# Patient Record
Sex: Female | Born: 1980 | Race: White | Hispanic: No | Marital: Single
Health system: Midwestern US, Academic
[De-identification: ages and names within clinical notes are randomized; demographics above are authoritative.]

---

## 2018-05-23 ENCOUNTER — Encounter: Admit: 2018-05-23 | Discharge: 2018-05-23 | Payer: BC Managed Care – HMO | Primary: Family

## 2018-05-23 DIAGNOSIS — R000 Tachycardia, unspecified: Principal | ICD-10-CM

## 2018-05-29 ENCOUNTER — Encounter: Admit: 2018-05-29 | Discharge: 2018-05-29 | Payer: BC Managed Care – HMO | Primary: Family

## 2018-05-29 DIAGNOSIS — R000 Tachycardia, unspecified: Principal | ICD-10-CM

## 2018-06-14 ENCOUNTER — Encounter: Admit: 2018-06-14 | Discharge: 2018-06-14 | Payer: BC Managed Care – HMO

## 2018-06-14 ENCOUNTER — Ambulatory Visit: Admit: 2018-06-14 | Discharge: 2018-06-14 | Payer: BC Managed Care – HMO | Primary: Family

## 2018-06-14 ENCOUNTER — Ambulatory Visit: Admit: 2018-06-14 | Discharge: 2018-06-15 | Payer: BC Managed Care – HMO | Primary: Family

## 2018-06-20 ENCOUNTER — Encounter: Admit: 2018-06-20 | Discharge: 2018-06-20 | Payer: BC Managed Care – HMO | Primary: Family

## 2018-06-23 ENCOUNTER — Encounter: Admit: 2018-06-23 | Discharge: 2018-06-23 | Payer: BC Managed Care – HMO

## 2018-07-04 ENCOUNTER — Encounter: Admit: 2018-07-04 | Discharge: 2018-07-04 | Payer: BC Managed Care – HMO

## 2018-07-04 ENCOUNTER — Encounter: Admit: 2018-07-04 | Discharge: 2018-07-04 | Payer: BC Managed Care – HMO | Primary: Family

## 2018-07-04 DIAGNOSIS — R000 Tachycardia, unspecified: Principal | ICD-10-CM

## 2018-07-05 ENCOUNTER — Encounter: Admit: 2018-07-05 | Discharge: 2018-07-05 | Payer: BC Managed Care – HMO

## 2018-07-05 ENCOUNTER — Encounter: Admit: 2018-07-05 | Discharge: 2018-07-05 | Payer: BC Managed Care – HMO | Primary: Family

## 2018-07-05 DIAGNOSIS — R000 Tachycardia, unspecified: Principal | ICD-10-CM

## 2018-07-06 ENCOUNTER — Encounter: Admit: 2018-07-06 | Discharge: 2018-07-06 | Payer: BC Managed Care – HMO | Primary: Family

## 2018-07-06 ENCOUNTER — Ambulatory Visit: Admit: 2018-07-06 | Discharge: 2018-07-07 | Payer: BC Managed Care – HMO | Primary: Family

## 2018-07-06 DIAGNOSIS — R42 Dizziness and giddiness: Principal | ICD-10-CM

## 2018-07-06 DIAGNOSIS — R000 Tachycardia, unspecified: Principal | ICD-10-CM

## 2018-07-06 DIAGNOSIS — R0789 Other chest pain: ICD-10-CM

## 2018-07-06 NOTE — Progress Notes
Medication List:     ??? ALPRAZolam (XANAX) 0.5 mg tablet Take 0.5 mg by mouth as Needed.   ??? famotidine (PEPCID) 20 mg tablet Take 20 mg by mouth daily.   ??? mag carb/aluminum hydrox/algin (MAGNESIUM-ALUMINUM-ALGINIC AC PO) Take  by mouth daily. *taking for anxiety   ??? MULTIVITAMIN PO Take 1 tablet by mouth.   ??? mupirocin (BACTROBAN) 2 % topical ointment Apply 1 g topically to affected area twice daily.   ??? omeprazole DR (PRILOSEC) 40 mg capsule Take 40 mg by mouth daily before breakfast.   ??? ondansetron (ZOFRAN ODT) 8 mg rapid dissolve tablet Dissolve 8 mg by mouth every 8 hours as needed for Nausea or Vomiting. Place on tongue to disolve.            Social History:     Social History     Socioeconomic History   ??? Marital status: Unknown     Spouse name: Not on file   ??? Number of children: Not on file   ??? Years of education: Not on file   ??? Highest education level: Not on file   Occupational History   ??? Not on file   Tobacco Use   ??? Smoking status: Never Smoker   ??? Smokeless tobacco: Never Used   Substance and Sexual Activity   ??? Alcohol use: Not Currently     Frequency: Never   ??? Drug use: Never   ??? Sexual activity: Not on file   Other Topics Concern   ??? Not on file   Social History Narrative   ??? Not on file       Family History:     Family History   Problem Relation Age of Onset   ??? Hypertension Mother    ??? None Reported Sister    ??? None Reported Sister        Pertinent Studies:       Echo Results  (Last 3 results in the past 3 years)    Echo EF LVIDD LA Size IVS LVPW Rest PAP    (06/14/18)  55 (06/14/18)  4.50 (06/14/18)  3.20 (06/14/18)  0.60 (06/14/18)  0.80 (06/14/18)  N/A                     Vitals:    07/06/18 0953   Weight: 68 kg (150 lb)   Height: 1.702 m (5' 7)   PainSc: Zero     Body mass index is 23.49 kg/m???.     Past Medical History  Patient Active Problem List    Diagnosis Date Noted   ??? Dizziness 07/06/2018   ??? Chest discomfort 07/06/2018   ??? Tachycardia 07/04/2018

## 2018-08-15 ENCOUNTER — Encounter: Admit: 2018-08-15 | Discharge: 2018-08-15 | Payer: BC Managed Care – HMO

## 2018-08-15 ENCOUNTER — Ambulatory Visit: Admit: 2018-08-15 | Discharge: 2018-08-15 | Payer: BC Managed Care – HMO | Primary: Family

## 2018-08-15 ENCOUNTER — Ambulatory Visit: Admit: 2018-08-15 | Discharge: 2018-08-15 | Payer: BC Managed Care – HMO

## 2018-08-15 ENCOUNTER — Encounter: Admit: 2018-08-15 | Discharge: 2018-08-15 | Payer: BC Managed Care – HMO | Primary: Family

## 2018-08-15 DIAGNOSIS — R000 Tachycardia, unspecified: Principal | ICD-10-CM

## 2018-08-15 DIAGNOSIS — K625 Hemorrhage of anus and rectum: Principal | ICD-10-CM

## 2018-08-15 DIAGNOSIS — K588 Other irritable bowel syndrome: Principal | ICD-10-CM

## 2018-08-15 DIAGNOSIS — Z8719 Personal history of other diseases of the digestive system: ICD-10-CM

## 2018-08-15 MED ORDER — BUSPIRONE 10 MG PO TAB
10 mg | ORAL_TABLET | Freq: Every day | ORAL | 0 refills | Status: AC
Start: 2018-08-15 — End: ?

## 2018-08-15 MED ORDER — SODIUM,POTASSIUM,MAG SULFATES 17.5-3.13-1.6 GRAM PO SOLR
1 | Freq: Once | ORAL | 0 refills | 30.00000 days | Status: AC
Start: 2018-08-15 — End: ?

## 2018-08-15 NOTE — Progress Notes
Telehealth Visit Note    Date of Service: 08/15/2018    Subjective:      Obtained patient's verbal consent to treat them and their agreement to [facility] financial policy and NPP via this telehealth visit during the [facility] Emergency       Catherine Marsh is a 38 y.o. female.    History of Present Illness  Obtained patient's verbal consent to treat them and their agreement to [facility] financial policy and NPP via this telehealth visit during the [facility] Emergency    Catherine Marsh is a very pleasant 38 years old Caucasian female with no significant past medical history, referred to our clinic for second opinion about heartburn, abdominal pain/cramp.  She was in wonderful health until December 2019 after having several episodes of abdominal discomfort, heartburn, occasional chest pain, and right upper quadrant pain cramps and dyspepsia, and after several ER visit seemingly the ultrasound showed few gallstone, which ultimately she underwent cholecystectomy on January 2020.  Unfortunately after cholecystectomy her symptoms persist, currently she has occasional heartburn either on or off PPI.  Previously been on different PPI including omeprazole 40 mg, and famotidine 20 mg, which somehow helped but interesting finding is when she is stop for up to 4 days, her symptoms are not recurring.  Mention in last week she is stop H2 blockers and PPI, and her symptoms of occasional heartburn, and discomfort not recurring.  Also complaint of abdominal pain and cramps, mostly periumbilical, with no radiation, mild, does not have correlation with meals or bowel movement.  Her bowel movement is infrequent, and mostly constipation does not take any stool softener.  She noticed bright red blood per rectum in few occasion, mostly after having bowel movement on wiping.  With painful bowel movement mostly after having hard infrequent bowel movement, and ER visit mention it could be due to anal fissure, she used a cream, stool softener currently, and increase fiber.  She still has some episodes of bright red blood per rectum.  She also mention had up to 20 pounds unintentional weight loss since surgery.  Episode of dizziness, tachycardia also which did CT of brain, chest and abdominal which did not reveal any significant pathology per patient (report is not available to review).  Recent visit in cardiology/electrophysiology, did not any significant cardiac cause for tachycardia.    Reports from outside, including EGD with biopsy did not reveal any evidence of erosive esophagitis, or eosinophilic esophagitis.  96 hours wireless pH study (Bravo), off PPI/H2 blockers showed a DeMeester score of 7.5, with no significant pathology acid reflux.    She denies any history of lactose intolerance although recently try to avoid milk and dairy product.  She has been tested for celiac serology and she was told it was negative.    Past medical history (anxiety  Past surgical history: Cholecystectomy  Family history: She denies family history of celiac, IBD and colorectal cancer.  Medications reviewed.  Social history: No smoking, no alcohol.    Medical History:   Diagnosis Date   ??? Tachycardia 07/04/2018     Surgical History:   Procedure Laterality Date   ??? DOPPLER ECHOCARDIOGRAPHY     ??? ELECTROCARDIOGRAM     ??? GALLBLADDER SURGERY     ??? HOLTER MONITOR       Family History   Problem Relation Age of Onset   ??? Hypertension Mother    ??? None Reported Sister    ??? None Reported Sister  Social History     Socioeconomic History   ??? Marital status: Unknown     Spouse name: Not on file   ??? Number of children: Not on file   ??? Years of education: Not on file   ??? Highest education level: Not on file   Occupational History   ??? Not on file   Tobacco Use   ??? Smoking status: Never Smoker   ??? Smokeless tobacco: Never Used   Substance and Sexual Activity   ??? Alcohol use: Not Currently Constitutional: Pt. is oriented to person, place, and time and in no distress.   HENT: Head: Normocephalic and atraumatic.   Mouth - Mucosa Moist.  Eyes: Conjunctivae and EOM are normal.   Neck: Normal range of motion.   Pulmonary/Chest: Effort normal No respiratory distress.    Abdominal: non distended , central Umbilicus   Musculoskeletal: Normal range of motion. There is no edema.  Neurological: Gait normal.   Skin:  No rash noted. No erythema.   Psychiatric: Affect and judgment normal.           Assessment and Plan:  38 years old female with history of anxiety, heartburn, change in bowel movement, and blood in the stool.    Heartburn: Since it could be functional heartburn,/esophageal hypersensitivity rather than true pathology acid reflux based on extended wireless pH study (96 hours Bravo), of acid reflux we did not show any significant pathology acid reflux or symptom association.  This will raise the question of potential esophageal motility disorder, ineffective esophageal motility, functional heartburn, hypersensitivity esophagus syndrome, etc.  Would consider high-resolution esophageal manometry.  Short course trial of buspirone 10 mg p.o. daily, for potential functional heartburn, ineffective esophageal motility disorder, for 30-day supply.  Potential side effect i.e. drowsiness, nausea, vomiting, anxiety, etc. has been discussed with the patient.    Given no significant pathology acid reflux, and no efficacy of PPI, I discussed and instruct patient to stop omeprazole at this time.    Brecher blood per rectum: Most likely anorectal disorders i.e. hemorrhoid, anal fissure, scratch, and trauma of hard bowel movement.  Given episodic of bright red blood per rectum, and rule out any underlying significant pathology i.e. large polyp, would consider to do colonoscopy.  Patient was encouraged to increase hydration and physical activity.  Cautious use of a stool softener. Also increased fibers, and using Metamucil also encouraged.    Will obtain report of CT scan of abdomen to review.    Patient will be seen in GI clinic to reevaluate and response to treatment to buspirone in 1-2 months.  If any side effect of spewing, patient will stop and inform us immediately.    Plan of care discussed in detail with patient, she verbalized understanding and agreed.    Thank you very much for the consult, and for allowing me to participate in this patient's care.     This note was in part completed with Dragon, a Chemical engineer.  Some grammatical errors may have occurred.  If you have concerns,please contact my office for clarification.                         Total time 45 minutes.

## 2018-10-25 ENCOUNTER — Encounter: Admit: 2018-10-25 | Discharge: 2018-10-25 | Primary: Family

## 2018-10-25 NOTE — Telephone Encounter
Pt called stating she was scheduled for a colonoscopy in July but found out he insurance was not covered at [facility]. Pt spoke with endoscopy scheduling today & was given a September date, she is wanting to know if she can wait that long. Pt states she had not had any rectal bleeing from May until recently. Informed pt she could wait & if something opened up soon I would let her know. Pt voices understanding.

## 2018-11-20 ENCOUNTER — Encounter: Admit: 2018-11-20 | Discharge: 2018-11-20 | Payer: BC Managed Care – HMO | Primary: Family

## 2018-11-28 ENCOUNTER — Encounter: Admit: 2018-11-28 | Discharge: 2018-11-28 | Primary: Family

## 2018-11-28 DIAGNOSIS — Z1159 Encounter for screening for other viral diseases: Secondary | ICD-10-CM

## 2018-11-28 NOTE — Telephone Encounter
Pt called & stated she had BM & had larger amt of bleeding & rectal pain. Pt states it has stopped since then. Pt scheduled for colonoscopy 9/3 & states she needs to be scheduled for her Covid testing. Pt works in [place] [employer] [place] would like to do it there or she is willing to drive to [facility].Attempted to schedule at [employer] in [place], transferred multiple times & then disconnected 3 times. Called pt back gave her the phone number for [facility] Covid hotline so she can call & make an appt.

## 2018-12-05 ENCOUNTER — Encounter: Admit: 2018-12-05 | Discharge: 2018-12-06 | Primary: Family

## 2018-12-05 DIAGNOSIS — Z01818 Encounter for other preprocedural examination: Secondary | ICD-10-CM

## 2018-12-05 DIAGNOSIS — Z1159 Encounter for screening for other viral diseases: Secondary | ICD-10-CM

## 2018-12-05 NOTE — Progress Notes
Patient arrived to [place] clinic for COVID-19 testing 12/05/18 0924. Patient identity confirmed via photo I.D. Nasopharyngeal procedure explained to the patient.   Nasopharyngeal swab completed left  Patient education provided given and instructed patient self isolate until contacted w/ results and further instructions. CDC handout on COVID-19 given to patient.   [email or web address]    Swab collected by Lorre Munroe, RN.    Date symptoms began/reason for testing: pre proc 9/3

## 2018-12-06 ENCOUNTER — Encounter: Admit: 2018-12-06 | Discharge: 2018-12-06 | Primary: Family

## 2018-12-06 LAB — COVID-19 (SARS-COV-2) PCR

## 2018-12-06 NOTE — Progress Notes
Patient had viewed results in MyChart. Additional MyChart message sent to patient.

## 2018-12-07 ENCOUNTER — Ambulatory Visit: Admit: 2018-12-07 | Discharge: 2018-12-07 | Primary: Family

## 2018-12-07 ENCOUNTER — Encounter: Admit: 2018-12-07 | Discharge: 2018-12-07 | Primary: Family

## 2018-12-07 DIAGNOSIS — K625 Hemorrhage of anus and rectum: Principal | ICD-10-CM

## 2018-12-07 DIAGNOSIS — K649 Unspecified hemorrhoids: Secondary | ICD-10-CM

## 2018-12-07 DIAGNOSIS — R000 Tachycardia, unspecified: Secondary | ICD-10-CM

## 2018-12-07 MED ORDER — LIDOCAINE (PF) 200 MG/10 ML (2 %) IJ SYRG
0 refills | Status: DC
Start: 2018-12-07 — End: 2018-12-07
  Administered 2018-12-07: 17:00:00 60 mg via INTRAVENOUS

## 2018-12-07 MED ORDER — PROMETHAZINE 25 MG/ML IJ SOLN
6.25 mg | Freq: Once | INTRAVENOUS | 0 refills | Status: CP
Start: 2018-12-07 — End: ?
  Administered 2018-12-07: 17:00:00 6.25 mg via INTRAVENOUS

## 2018-12-07 MED ORDER — PHENYLEPHRINE IN 0.9% NACL(PF) 1 MG/10 ML (100 MCG/ML) IV SYRG
0 refills | Status: DC
Start: 2018-12-07 — End: 2018-12-07
  Administered 2018-12-07 (×4): 100 ug via INTRAVENOUS

## 2018-12-07 MED ORDER — LACTATED RINGERS IV SOLP
1000 mL | INTRAVENOUS | 0 refills | Status: DC
Start: 2018-12-07 — End: 2018-12-07
  Administered 2018-12-07: 16:00:00 1000.000 mL via INTRAVENOUS
  Administered 2018-12-07: 17:00:00 1000 mL via INTRAVENOUS

## 2018-12-07 MED ORDER — PROPOFOL INJ 10 MG/ML IV VIAL
0 refills | Status: DC
Start: 2018-12-07 — End: 2018-12-07
  Administered 2018-12-07 (×8): 20 mg via INTRAVENOUS

## 2018-12-07 MED ORDER — PROPOFOL 10 MG/ML IV EMUL 20 ML (INFUSION)(AM)(OR)
INTRAVENOUS | 0 refills | Status: DC
Start: 2018-12-07 — End: 2018-12-07
  Administered 2018-12-07: 17:00:00 150 ug/kg/min via INTRAVENOUS

## 2018-12-07 NOTE — Anesthesia Post-Procedure Evaluation
Post-Anesthesia Evaluation    Name: Catherine Marsh      MRN: [id number]     DOB: [birth year]     Age: 38 y.o.     Sex: female   __________________________________________________________________________     Procedure Information     Anesthesia Start Date/Time:  12/07/18 1209    Procedure:  COLONOSCOPY DIAGNOSTIC WITH SPECIMEN COLLECTION BY BRUSHING/ WASHING - FLEXIBLE (N/A )    Location:  ENDO 5 / ENDO/GI    Surgeon:  Eligah East, MD          Post-Anesthesia Vitals  BP: 105/63 (09/03 1310)  Temp: 36.9 C (98.4 F) (09/03 1243)  Pulse: 78 (09/03 1310)  Respirations: 14 PER MINUTE (09/03 1310)  SpO2: 100 % (09/03 1310)  SpO2 Pulse: 79 (09/03 1310)   Vitals Value Taken Time   BP 105/63 12/07/2018  1:10 PM   Temp 36.9 C (98.4 F) 12/07/2018 12:43 PM   Pulse 78 12/07/2018  1:10 PM   Respirations 14 PER MINUTE 12/07/2018  1:10 PM   SpO2 100 % 12/07/2018  1:10 PM         Post Anesthesia Evaluation Note    Evaluation location: Pre/Post  Patient participation: recovered; patient participated in evaluation  Level of consciousness: alert  Pain management: adequate    Hydration: normovolemia  Temperature: 36.0C - 38.4C  Airway patency: adequate    Perioperative Events      Postoperative Status  Cardiovascular status: hemodynamically stable  Respiratory status: spontaneous ventilation  Additional comments: Pt alert with no complaints- pt with erythema on right arm after phenergan given.  Iv changed to other side and warm compress placed.  Pt with no further redness to arm prior to discharge.  R.Kataryna Mcquilkin MD        Perioperative Events  Perioperative Event: No  Emergency Case Activation: No

## 2018-12-07 NOTE — Anesthesia Post-Procedure Evaluation
Post-Anesthesia Evaluation    Name: Catherine Marsh      MRN: [id number]     DOB: November 25, 1980     Age: 38 y.o.     Sex: female   __________________________________________________________________________     Procedure Information     Anesthesia Start Date/Time:  12/07/18 1209    Procedure:  COLONOSCOPY DIAGNOSTIC WITH SPECIMEN COLLECTION BY BRUSHING/ WASHING - FLEXIBLE (N/A )    Location:  ENDO 5 / ENDO/GI    Surgeon:  Eligah East, MD          Post-Anesthesia Vitals      Vitals Value Taken Time   BP 105/63 12/07/2018  1:10 PM   Temp 36.9 C (98.4 F) 12/07/2018 12:43 PM   Pulse 78 12/07/2018  1:10 PM   Respirations 14 PER MINUTE 12/07/2018  1:10 PM   SpO2 100 % 12/07/2018  1:10 PM         Post Anesthesia Evaluation Note    Evaluation location: Pre/Post  Patient participation: recovered; patient participated in evaluation  Level of consciousness: alert  Pain management: adequate    Hydration: normovolemia  Airway patency: adequate    Perioperative Events      Postoperative Status  Cardiovascular status: hemodynamically stable  Respiratory status: spontaneous ventilation        Perioperative Events  Perioperative Event: No  Emergency Case Activation: No

## 2018-12-07 NOTE — Anesthesia Pre-Procedure Evaluation
Anesthesia Pre-Procedure Evaluation    Name: Catherine Marsh      MRN: [id number]     DOB: 1980/08/16     Age: 38 y.o.     Sex: female   _________________________________________________________________________     Procedure Info:   Procedure Information     Date/Time:  12/07/18 1200    Procedure:  COLONOSCOPY DIAGNOSTIC WITH SPECIMEN COLLECTION BY BRUSHING/ WASHING - FLEXIBLE (N/A )    Location:  ENDO 5 / ENDO/GI    Surgeon:  Eliott Nine, MD          Physical Assessment  Vital Signs (last filed in past 24 hours):         Patient History   Allergies   Allergen Reactions   ??? Latex RASH   ??? Percocet [Oxycodone-Acetaminophen] RASH   ??? [employer] Containing Products] UNKNOWN   ??? Pcn [Penicillins] UNKNOWN   ??? Tramadol FLUSHING (SKIN)        Current Medications    Medication Directions   ALPRAZolam (XANAX) 0.5 mg tablet Take 0.5 mg by mouth as Needed.   busPIRone (BUSPAR) 10 mg tablet Take one tablet by mouth daily.   famotidine (PEPCID) 20 mg tablet Take 20 mg by mouth daily.   fluticasone propionate (FLONASE NA) Apply  into nose as directed as Needed.   mag carb/aluminum hydrox/algin (MAGNESIUM-ALUMINUM-ALGINIC AC PO) Take  by mouth daily. *taking for anxiety   MULTIVITAMIN PO Take 1 tablet by mouth.   mupirocin (BACTROBAN) 2 % topical ointment Apply 1 g topically to affected area twice daily.   omeprazole DR (PRILOSEC) 40 mg capsule Take 40 mg by mouth daily before breakfast.   ondansetron (ZOFRAN ODT) 8 mg rapid dissolve tablet Dissolve 8 mg by mouth every 8 hours as needed for Nausea or Vomiting. Place on tongue to disolve.         Review of Systems/Medical History        PONV Screening: Female gender and Non-smoker        Pulmonary - negative          Cardiovascular         Exercise tolerance: >4 METS      Beta Blocker therapy: No      Beta blockers within 24 hours: No      Dysrhythmias (Sinus tachycardia)      GI/Hepatic/Renal         GERD,       Neuro/Psych           Psychiatric history          Anxiety Endocrine/Other - negative     Physical Exam    Airway Findings      Mallampati: II      TM distance: >3 FB      Neck ROM: full      Mouth opening: good      Airway patency: adequate    Cardiovascular Findings: Negative      Pulmonary Findings: Negative      Abdominal Findings: Negative      Neurological Findings: Negative         Diagnostic Tests  Hematology: No results found for: HGB, HCT, PLTCT, WBC, NEUT, ANC, LYMPH, ALC, ABSLYMPHCT, MONA, AMC, EOSA, ABC, BASOPHILS, MCV, MCH, MCHC, MPV, RDW      General Chemistry: No results found for: NA, K, CL, CO2, GAP, BUN, CR, GLU, CA, KETONES, ALBUMIN, LACTIC, OBSCA, MG, TOTBILI, TOTBILCB, PO4   Coagulation: No results found for: PT, PTT, INR  Anesthesia Plan    ASA score: 1   Plan: MAC  Induction method: intravenous  NPO status: acceptable      Informed Consent  Anesthetic plan and risks discussed with patient.  Use of blood products discussed with patient  Blood Consent: consented      Plan discussed with: anesthesiologist and CRNA.

## 2018-12-08 ENCOUNTER — Encounter: Admit: 2018-12-08 | Discharge: 2018-12-08 | Primary: Family

## 2018-12-08 DIAGNOSIS — R000 Tachycardia, unspecified: Secondary | ICD-10-CM

## 2018-12-28 ENCOUNTER — Encounter: Admit: 2018-12-28 | Discharge: 2018-12-28 | Payer: BC Managed Care – HMO | Primary: Family

## 2018-12-28 NOTE — Telephone Encounter
Pt called stating she is still having symptoms of bloating, constipation/diarrhea, abd pain, indigestion. She would like a FU appt with Dr. Ebony Hail. Returned pt's call, went to VM. Left message that she could schedule another visit & left the phone # for scheduling.

## 2019-03-15 ENCOUNTER — Encounter: Admit: 2019-03-15 | Discharge: 2019-03-15 | Payer: BC Managed Care – HMO | Primary: Family

## 2019-03-15 NOTE — Telephone Encounter
Pt called stating she had some lab done at her PCP which showed some inflammation. Called pt back to get more details, went to VM. Requested pt call back left VM to call us with where we can get a copy of the records.

## 2019-03-20 ENCOUNTER — Encounter: Admit: 2019-03-20 | Discharge: 2019-03-20 | Payer: BC Managed Care – HMO | Primary: Family

## 2019-03-20 NOTE — Telephone Encounter
Dr. Ebony Hail spoke with Dr. Doren Custard. Dr. Ebony Hail requested OV added on tomorrow. Called pt went to VM, left message for pt to call & placed a telehealth appt for 03/21/19 at 0930. My chart message sent also.

## 2019-03-20 NOTE — Telephone Encounter
Manuela SchwartzDamaris Schooner to Dr. Edythe Clarity OB/[facility] regarding chronic abdominal pain.  Seemingly patient is complaining of chronic abdominal pain and has an issue with coverage by insurance.  Would be happy to see her back in my clinic.  Please reschedule the telehealth or telephone visit for tomorrow.  Thank you

## 2019-03-21 ENCOUNTER — Encounter: Admit: 2019-03-21 | Discharge: 2019-03-21 | Payer: BC Managed Care – HMO | Primary: Family

## 2019-03-21 ENCOUNTER — Ambulatory Visit: Admit: 2019-03-21 | Discharge: 2019-03-22 | Payer: BC Managed Care – HMO | Primary: Family

## 2019-03-21 DIAGNOSIS — R638 Other symptoms and signs concerning food and fluid intake: Secondary | ICD-10-CM

## 2019-03-21 DIAGNOSIS — R634 Abnormal weight loss: Secondary | ICD-10-CM

## 2019-03-21 DIAGNOSIS — R000 Tachycardia, unspecified: Secondary | ICD-10-CM

## 2019-03-21 NOTE — Progress Notes
Telehealth Visit Note    Date of Service: 03/21/2019    Subjective:      Obtained patient's verbal consent to treat them and their agreement to [facility] financial policy and NPP via this telehealth visit during the [facility] Emergency       Catherine Marsh is a 38 y.o. female.    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    I spoke to her OB/GYN Dr. Edilia Bo yesterday who was concerned about change in appetite and chronic abdominal pain.    Further questioning today she complains of variable abdominal pain very mild, occasionally start right upper quadrant, and then location left upper quadrant, periumbilical and lower abdomen.  Pain is very mild, minimal and manageable with no pain medicine required.  Concerning question for her is a change in appetite.  She mention days with no appetite and no desire to eat which intermittent with days that she eats multiple meals continuously.  Her weight has been stable at 170 pounds since September.    She denies any change in bowel movement, nausea/vomiting, dysphagia, odynophagia, or blood in stool.    She also notified and abdominal CT scan done in outside facility per patient's report was unremarkable (report not available to review today).    She also following gluten-free, dairy free diet.    Medical History:   Diagnosis Date   ? Tachycardia 07/04/2018     Surgical History:   Procedure Laterality Date   ? COLONOSCOPY DIAGNOSTIC WITH SPECIMEN COLLECTION BY BRUSHING/ WASHING - FLEXIBLE N/A 12/07/2018    Performed by Eliott Nine, MD at [facility] ENDO   ? DOPPLER ECHOCARDIOGRAPHY     ? ELECTROCARDIOGRAM     ? GALLBLADDER SURGERY     ? HOLTER MONITOR       Family History   Problem Relation Age of Onset ? Hypertension Mother    ? None Reported Sister    ? None Reported Sister      Social History     Socioeconomic History   ? Marital status: Single     Spouse name: Not on file   ? Number of children: Not on file   ? Years of education: Not on file   ? Highest education level: Not on file   Occupational History   ? Not on file   Tobacco Use   ? Smoking status: Never Smoker   ? Smokeless tobacco: Never Used   Substance and Sexual Activity   ? Alcohol use: Not Currently     Frequency: Never   ? Drug use: Never   ? Sexual activity: Not on file   Other Topics Concern   ? Not on file   Social History Narrative   ? Not on file                      Review of Systems   Constitutional: Positive for appetite change.   HENT: Positive for ear pain, postnasal drip, rhinorrhea and sinus pressure.    Eyes: Positive for photophobia, redness and itching.   Respiratory: Positive for shortness of breath.    Cardiovascular: Positive for palpitations.   Gastrointestinal: Positive for abdominal pain, anal bleeding, blood in stool, constipation, diarrhea and rectal pain.  Endocrine: Positive for polydipsia and polyphagia.   Genitourinary: Positive for decreased urine volume and flank pain.   Musculoskeletal: Positive for arthralgias, back pain, myalgias, neck pain and neck stiffness.   Allergic/Immunologic: Positive for environmental allergies and food allergies.   Neurological: Positive for headaches.   Hematological: Bruises/bleeds easily.   Psychiatric/Behavioral: Positive for agitation, dysphoric mood and sleep disturbance. The patient is nervous/anxious.    All other systems reviewed and are negative.        Objective:         ? ALPRAZolam (XANAX) 0.5 mg tablet Take 0.5 mg by mouth as Needed.   ? busPIRone (BUSPAR) 10 mg tablet Take one tablet by mouth daily.   ? dicyclomine (BENTYL) 20 mg tablet TAKE 1 TABLET BY MOUTH 4 TIMES DAILY AS NEEDED FOR 10 DAYS   ? famotidine (PEPCID) 20 mg tablet Take 20 mg by mouth daily. ? fluticasone propionate (FLONASE NA) Apply  into nose as directed as Needed.   ? mag carb/aluminum hydrox/algin (MAGNESIUM-ALUMINUM-ALGINIC AC PO) Take  by mouth daily. *taking for anxiety   ? MULTIVITAMIN PO Take 1 tablet by mouth.   ? mupirocin (BACTROBAN) 2 % topical ointment Apply 1 g topically to affected area twice daily.   ? omeprazole DR (PRILOSEC) 40 mg capsule Take 40 mg by mouth daily before breakfast.   ? ondansetron (ZOFRAN ODT) 8 mg rapid dissolve tablet Dissolve 8 mg by mouth every 8 hours as needed for Nausea or Vomiting. Place on tongue to disolve.     There were no vitals filed for this visit.  There is no height or weight on file to calculate BMI.     Telehealth Patient Reported Vitals     Row Name 03/21/19 0911                BP:  ? pt is unable to take bp        Temp:  37.2 ?C (99 ?F)        Temp Source:  Oral        Weight:  77.1 kg (170 lb)        Height:  170.2 cm (67)        Pain Score:  Three        Pain Location:  ABDOMEN              Physical Exam  Complete physical exam not feasible due to nature of telehealth visit       Assessment and Plan:  38 year old female follow-up visit for change in appetite, abdominal pain.      Abdominal pain: Variable, does not follow a pattern.  Pain is occasional positional with change in left or right.  Patient also has no correlation with meals or bowel movement.  Gust with patient, this could be some musculoskeletal type pain of her referral to pain clinic.  Since she thinks his pain is very mild and manageable not willing to pursue a pain clinic consultation at this time.    Variable change in appetite: Could be multifactorial, including hormonal changes, thyroid malfunction, stress and emotional instability, etc.  We will check basic labs including thyroid function, CBC CMP.  Also she notified me today that recent lab some inflammatory markers were high (report not available to review.  We will order CRP. Discussed with patient to get a deep look to her diet and nutrient need with dietary/nutrition consultation ASAP.  She is very happy and agree with with dietitian consultation  preferably before end of 2020.    Follow-up in GI/telehealth clinic as needed.    Plan of care discussed in detail with patient, she verbalized understanding and agreed.    Thank you very much for the consult, and for allowing me to participate in this patient's care.     This note was in part completed with Dragon, a Chemical engineer.  Some grammatical errors may have occurred.  If you have concerns,please contact my office for clarification.                             26 minutes spent on this patient's encounter with counseling and coordination of care taking >50% of the visit.

## 2019-03-21 NOTE — Telephone Encounter
Lab orders faxed to [facility] office with successful transition report. Called office, spoke with her nurse to inform them pt has appt with them today. They will draw labs. Requested fax of CT abd/pelvis & inflammatory markers previously done. Nurse states they will fax results to [place].

## 2019-03-27 ENCOUNTER — Encounter: Admit: 2019-03-27 | Discharge: 2019-03-27 | Payer: BC Managed Care – HMO | Primary: Family

## 2019-08-01 IMAGING — CT BRAIN WO(Adult)
3 of 4 series · 14 of 47 positions shown, 16 images · non-contrast
Comparison: none

[Series 2: brain ax 5.00 hr40 s3 · axial · 0.32mm/px · z∈[-621,-494]mm · 8 of 32 slices shown, 10 images]
[im 3/32  brain]
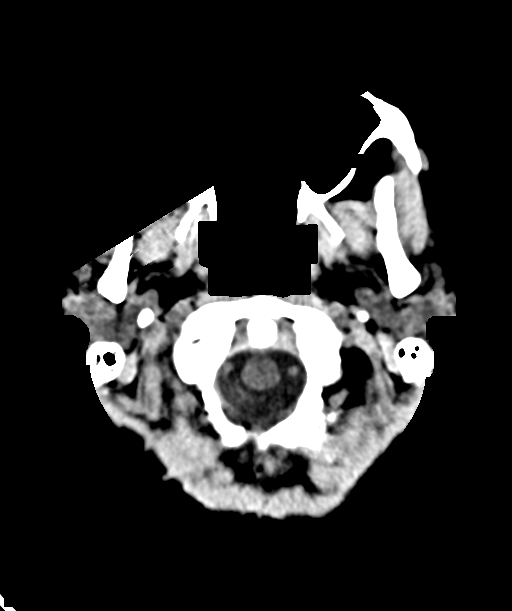
[im 3/32  bone]
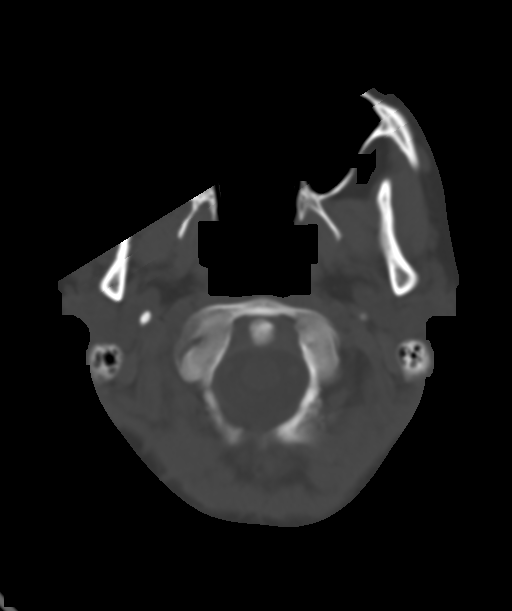
[im 7/32  brain]
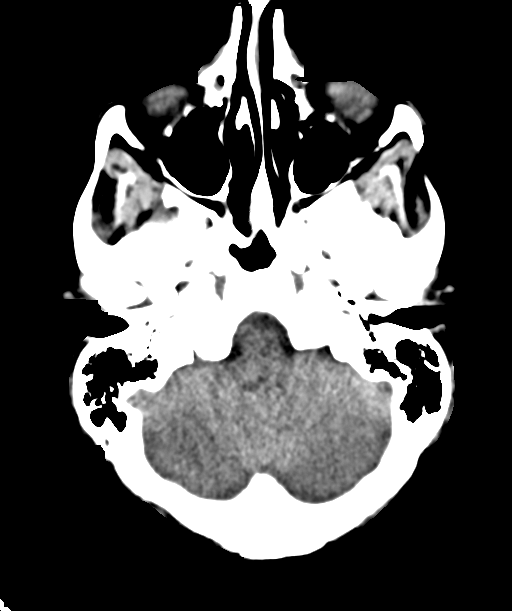
[im 12/32  brain]
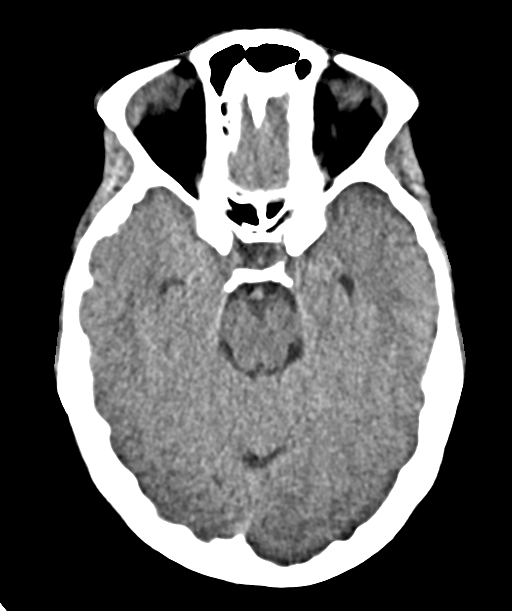
[im 14/32  brain]
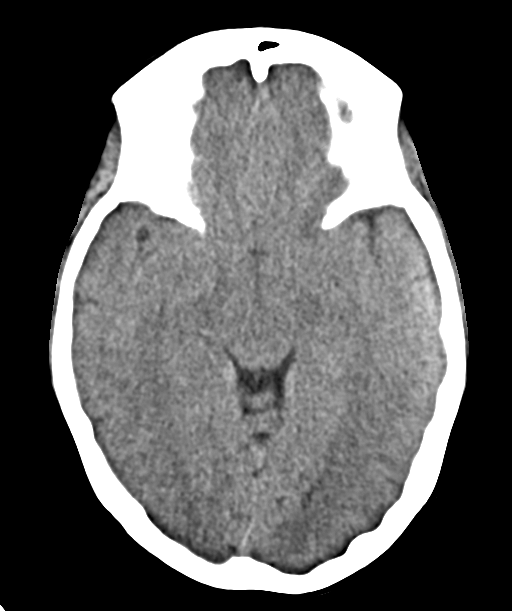
[im 18/32  brain]
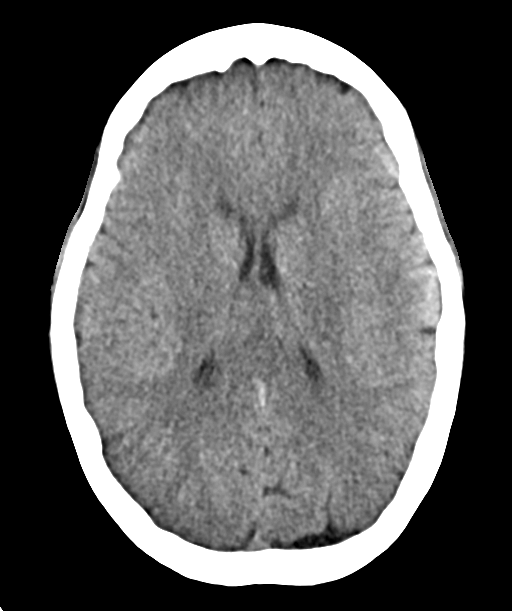
[im 18/32  bone]
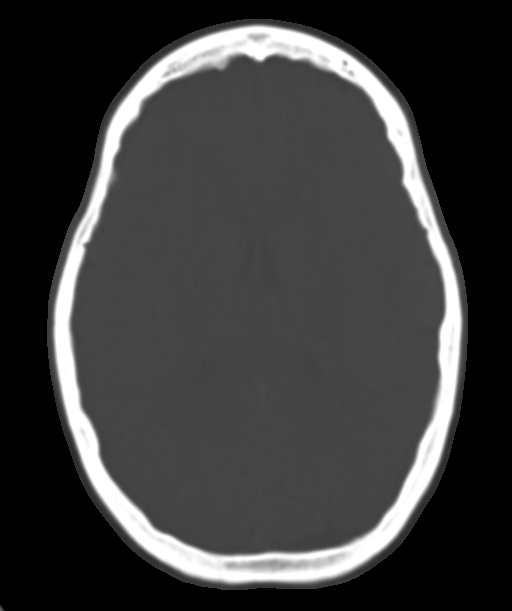
[im 20/32  brain]
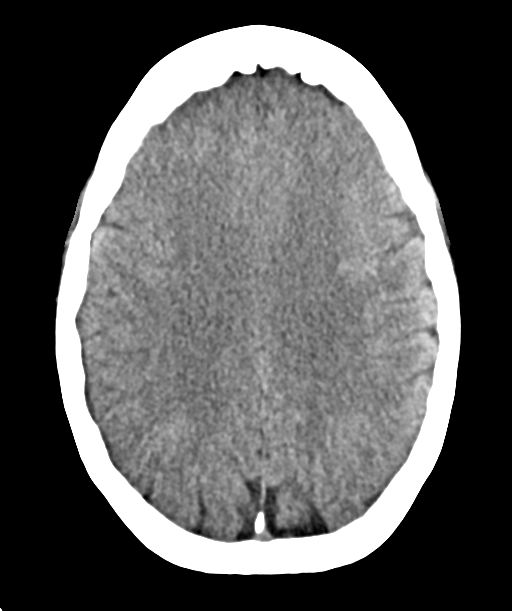
[im 25/32  brain]
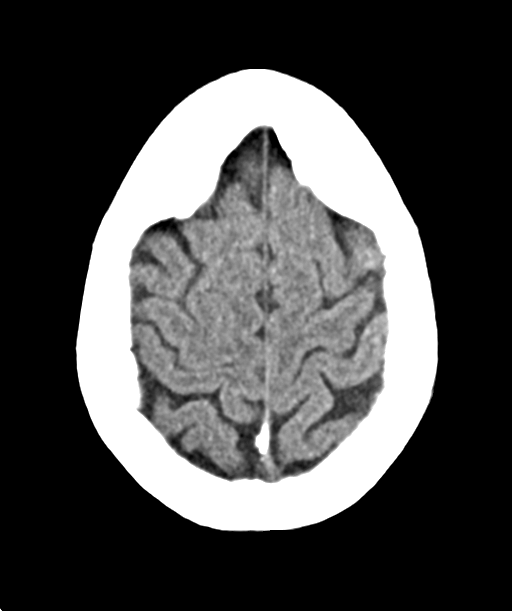
[im 29/32  brain]
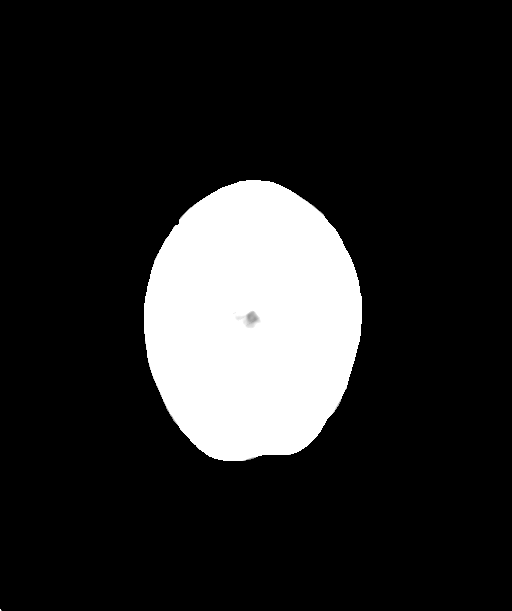

[Series 4: brain cor 5.00 hr40 s3 · coronal · 0.31mm/px · 3 of 39 slices shown]
[im 13/39  brain]
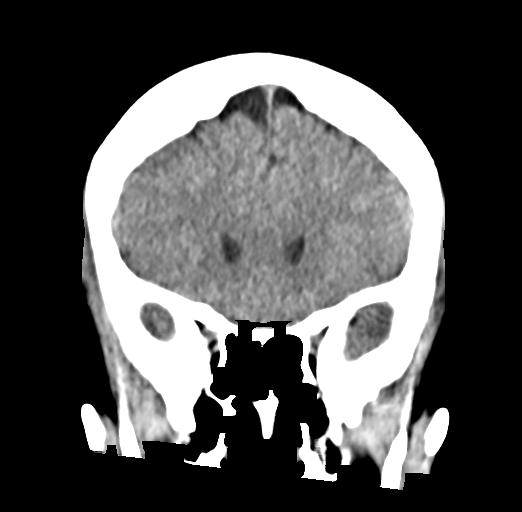
[im 17/39  brain]
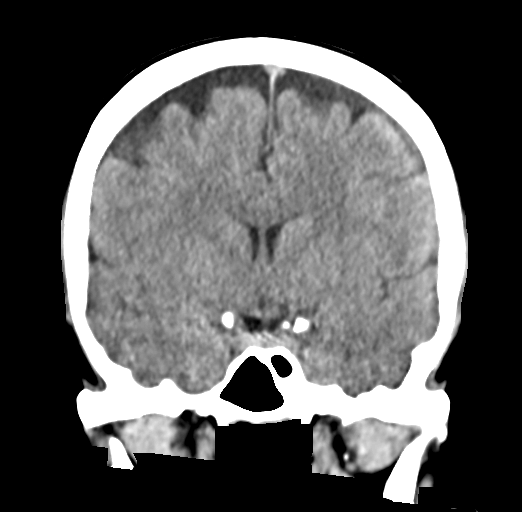
[im 22/39  brain]
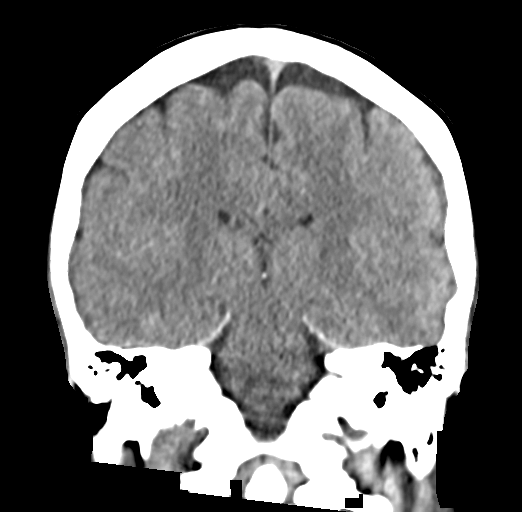

[Series 6: brain sag 5.00 hr40 s3 · sagittal · 0.31mm/px · 3 of 32 slices shown]
[im 11/32  brain]
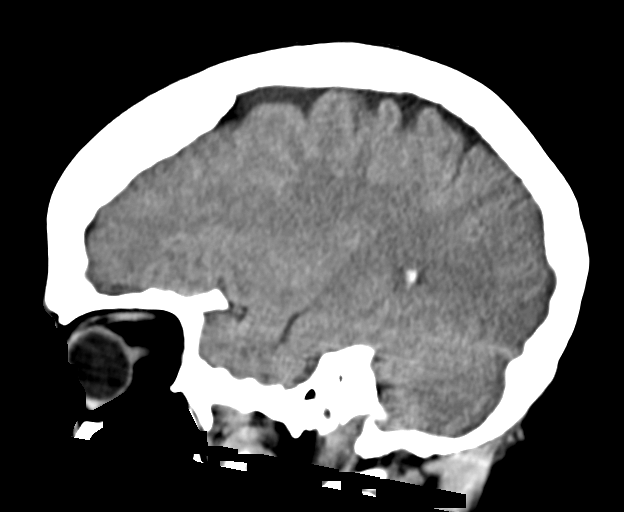
[im 16/32  brain]
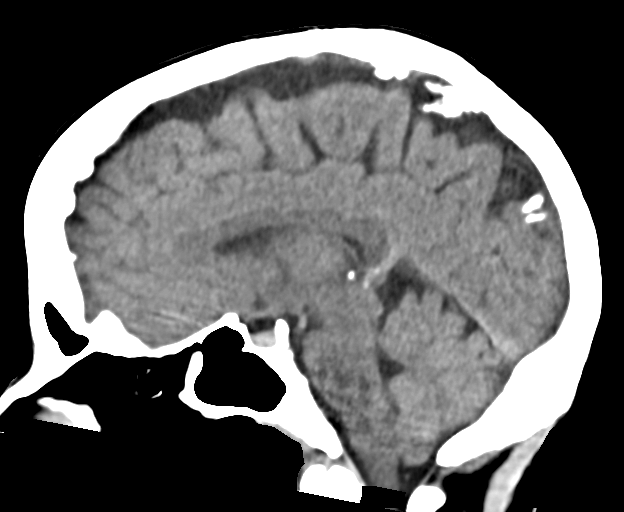
[im 21/32  brain]
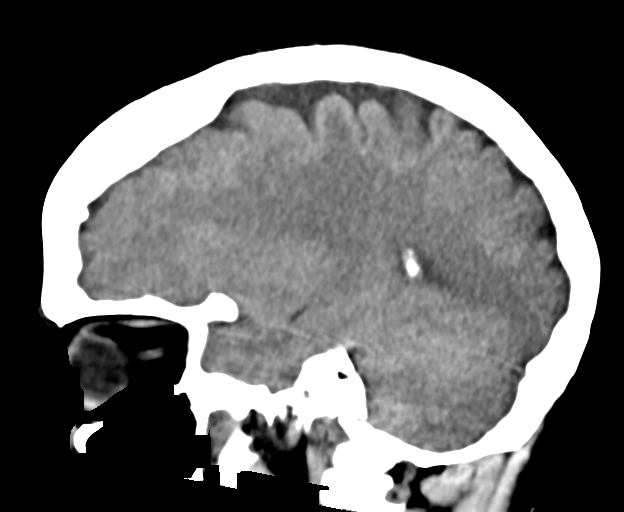

[14 of 47 positions shown; findings below may reference images not displayed]

EXAM
Head CT without contrast.

INDICATION
Dizziness, persistent headache
PT C/O HEADACHES, DIZZINESS, HEAT/NUMBNESS IN POSTERIOR REGION OF HEAD. NKI. PT DENIES PREGNANCY.
CT/NM 3/0. TJ/CP

FINDINGS
CT of the head was performed without contrast.
All CT scans at this facility use dose modulation, iterative reconstruction, and/or weight based
dosing when appropriate to reduce radiation dose to as low as reasonably achievable. In the past 12
months, there have been 3 CT scans and 0 nuclear medicine myocardial perfusion scans.
There is no acute intracranial hemorrhage. There is no mass lesion or shift to midline structures.
There is normal density throughout the cerebral hemispheres.
There is no mass lesion or midline shift.
There is no hydrocephalus.
There is no abnormality of the skull.

IMPRESSION
Normal head CT.

Tech Notes:

PT C/O HEADACHES, DIZZINESS, HEAT/NUMBNESS IN POSTERIOR REGION OF HEAD. NKI. PT DENIES PREGNANCY.
CT/NM 3/0. TJ/CP

## 2023-05-13 ENCOUNTER — Encounter: Admit: 2023-05-13 | Discharge: 2023-05-13 | Payer: BC Managed Care – HMO | Primary: Family

## 2023-11-26 ENCOUNTER — Encounter: Admit: 2023-11-26 | Discharge: 2023-11-26 | Payer: BLUE CROSS/BLUE SHIELD | Primary: Family

## 2023-12-02 ENCOUNTER — Encounter: Admit: 2023-12-02 | Discharge: 2023-12-02 | Payer: BLUE CROSS/BLUE SHIELD | Primary: Family

## 2024-04-09 ENCOUNTER — Encounter: Admit: 2024-04-09 | Discharge: 2024-04-09 | Primary: Family

## 2024-04-16 ENCOUNTER — Encounter: Admit: 2024-04-16 | Discharge: 2024-04-16 | Payer: PRIVATE HEALTH INSURANCE | Primary: Family

## 2024-04-17 NOTE — Progress Notes [1]
 General GI New Patient Consult Note  Patient Name:Catherine Marsh  Date of Service: 04/18/2024   Referring: Raymondo Lauraine PARAS, APRN         Impression/Assessment:  Catherine Marsh is a 44yo woman who is referred for evaluation of nausea and abdominal bloating/discomfort. Prior evaluations have been largely normal, including reported celiac testing, EGD, colonoscopy, and cross sectional imaging. 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 She has been managing her symptoms well with dietary modification and declined any additional pharmacotherapy.       Recommendations:  -- CBC, CMP, TSH, celiac screen + IgA, iron studies, B12  -- Recommend increasing dietary fiber vs starting daily Metamucil supplement  -- Offered prescription of Bentyl PRN, which she would like to hold off for now  -- Request records of 2020 EGD with Bravo testing and pathology results      Alfonso LITTIE Bock, MD  Gastroenterology and Hepatology      ==========================================================    Subjective    History of Present Illness  History of Present Illness  Catherine Marsh is a 44yo woman who is referred for evaluation of nausea and abdominal bloating. She was seen by Dr. Noralyn in 2020 for functional abdominal pain and was offered a referral to pain clinic at that time which she declined due to minimal symptoms. She was also trialed on buspar  for functional heartburn and was recommended to stop PPI given no significant pathologic reflux on outside 96h Bravo. Today she reports taking buspar  for 30 days which did resolve her symptoms.     Her symptoms have been present for 7 years, primarily describes as migrating abdominal pain without clear identifiable trigger, though improved with defecation, as well as alternating bowel movements. 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 She will have occasional flaring of symptoms, which occurs about once per month. Outside of these, she feels generally well. She generally has BM once daily, but intermittently has constipation. Denies vomiting and unintentional weight changes. She has had significantly relief with daily probiotic and feels that she generally manages her symptoms well with dietary modification.        Chart review notable for:  Alpha gal negative 2020  CTAP 2020: sp cholecystectomy  CLN 2020: normal TI, normal colon, hemorrhoids    Review of Systems:  10 point ROS negative except as above.  Please see HPI for additional pertinent documentation    Past Medical History:  Past Medical History[1]     Medications:   Current Outpatient Medications   Medication Instructions    ALPRAZolam (XANAX) 0.5 mg, AS NEEDED    busPIRone  (BUSPAR ) 10 mg, Oral, DAILY    dicyclomine (BENTYL) 20 mg tablet TAKE 1 TABLET BY MOUTH 4 TIMES DAILY AS NEEDED FOR 10 DAYS    famotidine (PEPCID) 20 mg, DAILY    fluticasone propionate (FLONASE NA) AS NEEDED    mag carb/aluminum hydrox/algin (MAGNESIUM-ALUMINUM-ALGINIC AC PO) DAILY    MULTIVITAMIN PO 1 tablet    mupirocin (BACTROBAN) 1 g, TWICE DAILY    omeprazole DR (PRILOSEC) 40 mg, DAILY BEFORE BREAKFAST    ondansetron (ZOFRAN ODT) 8 mg, EVERY  8 HOURS PRN       Surgical History:  Surgical History:   Procedure Laterality Date    COLONOSCOPY DIAGNOSTIC WITH SPECIMEN  COLLECTION BY BRUSHING/ WASHING - FLEXIBLE N/A 12/07/2018    Performed by Noralyn Blossom, MD at [facility] ENDO    DOPPLER ECHOCARDIOGRAPHY      ELECTROCARDIOGRAM      GALLBLADDER SURGERY      HOLTER MONITOR      HX CHOLECYSTECTOMY  2019       Social History:  No alcohol, tobacco, THC.    Pertinent Family History:  Unknown history of biological father  Mother with cholecystectomy      Objective     Physical Exam  Vitals:    04/18/24 0758   BP: 107/64   Pulse: 94   Temp: 36.3 ?C (97.3 ?F)   Resp: 16   SpO2: 100%     Body mass index is 28.98 kg/m?SABRA    Constitutional- Vitals above, no acute distress.   Head - Normocephalic, atraumatic.   Eyes -  EOMI grossly. No icterus or injection.   Ears, nose, mouth, throat- No oral ulcer or bleeding.   Neck - No swelling or tracheal deviation.   Respiratory - Symmetric chest rise, no increased work of breathing.  Cardiovascular - No pedal edema.  Gastrointestinal - Soft, nondistended, nontender to palpation.  Skin - No exposed rash, lesion.  Neurologic - No CN deficit, normal fluid speech  Psychiatric - Judgement intact, thought content appropriate.      Labs/Imaging/Procedures and Diagnostic tests:  As reviewed in HPI unless otherwise specified    _____________________________  45 minutes spent on this patient's encounter with counseling and coordination of care taking >50% of the visit.         [1]   Past Medical History:  Diagnosis Date    Allergy     several food allergies, latex, and some pain medications, penicillin    Asthma     Gastrointestinal disorder 10/22/2017    Tachycardia 07/04/2018

## 2024-04-18 ENCOUNTER — Encounter: Admit: 2024-04-18 | Discharge: 2024-04-18 | Payer: PRIVATE HEALTH INSURANCE | Primary: Family

## 2024-04-18 ENCOUNTER — Ambulatory Visit: Admit: 2024-04-18 | Discharge: 2024-04-19 | Payer: PRIVATE HEALTH INSURANCE | Primary: Family

## 2024-04-18 VITALS — BP 107/64 | HR 94 | Temp 97.30000°F | Resp 16 | Ht 67.0 in | Wt 185.0 lb

## 2024-04-18 DIAGNOSIS — K582 Mixed irritable bowel syndrome: Secondary | ICD-10-CM

## 2024-04-18 DIAGNOSIS — R5383 Other fatigue: Principal | ICD-10-CM

## 2024-04-19 ENCOUNTER — Encounter: Admit: 2024-04-19 | Discharge: 2024-04-19 | Payer: PRIVATE HEALTH INSURANCE | Primary: Family
